# Patient Record
Sex: Female | Born: 1970 | Race: White | Hispanic: No | Marital: Married | Smoking: Never smoker
Health system: Southern US, Community
[De-identification: ages and names within clinical notes are randomized; demographics above are authoritative.]

## PROBLEM LIST (undated history)

## (undated) DIAGNOSIS — E119 Type 2 diabetes mellitus without complications: Secondary | ICD-10-CM

## (undated) DIAGNOSIS — I10 Essential (primary) hypertension: Secondary | ICD-10-CM

## (undated) HISTORY — DX: Essential (primary) hypertension: I10

## (undated) HISTORY — DX: Type 2 diabetes mellitus without complications: E11.9

## (undated) HISTORY — PX: KNEE SURGERY: SHX244

---

## 2012-02-22 ENCOUNTER — Emergency Department (HOSPITAL_COMMUNITY)
Admission: EM | Admit: 2012-02-22 | Discharge: 2012-02-23 | Disposition: A | Discharge status: Home / Self Care | Payer: BC Managed Care – PPO | Attending: Emergency Medicine | Admitting: Emergency Medicine

## 2012-02-22 ENCOUNTER — Encounter (HOSPITAL_COMMUNITY): Payer: Self-pay | Admitting: Emergency Medicine

## 2012-02-22 ENCOUNTER — Emergency Department (HOSPITAL_COMMUNITY): Payer: BC Managed Care – PPO

## 2012-02-22 DIAGNOSIS — Y92410 Unspecified street and highway as the place of occurrence of the external cause: Secondary | ICD-10-CM | POA: Insufficient documentation

## 2012-02-22 DIAGNOSIS — S060X9A Concussion with loss of consciousness of unspecified duration, initial encounter: Secondary | ICD-10-CM

## 2012-02-22 DIAGNOSIS — Y9389 Activity, other specified: Secondary | ICD-10-CM | POA: Insufficient documentation

## 2012-02-22 DIAGNOSIS — S301XXA Contusion of abdominal wall, initial encounter: Secondary | ICD-10-CM

## 2012-02-22 DIAGNOSIS — S298XXA Other specified injuries of thorax, initial encounter: Secondary | ICD-10-CM | POA: Insufficient documentation

## 2012-02-22 DIAGNOSIS — S060XAA Concussion with loss of consciousness status unknown, initial encounter: Secondary | ICD-10-CM | POA: Insufficient documentation

## 2012-02-22 DIAGNOSIS — Z7982 Long term (current) use of aspirin: Secondary | ICD-10-CM | POA: Insufficient documentation

## 2012-02-22 MED ORDER — MORPHINE SULFATE 4 MG/ML IJ SOLN
6.0000 mg | Freq: Once | INTRAMUSCULAR | Status: DC
  Filled 2012-02-22: qty 1

## 2012-02-22 MED ORDER — ONDANSETRON HCL 4 MG/2ML IJ SOLN
4.0000 mg | Freq: Once | INTRAMUSCULAR | Status: DC
  Filled 2012-02-22: qty 2

## 2012-02-22 MED ORDER — ONDANSETRON HCL 4 MG/2ML IJ SOLN
4.0000 mg | Freq: Once | INTRAMUSCULAR | Status: AC
  Administered 2012-02-22: 4 mg via INTRAVENOUS

## 2012-02-22 MED ORDER — MORPHINE SULFATE 4 MG/ML IJ SOLN
6.0000 mg | Freq: Once | INTRAMUSCULAR | Status: AC
  Administered 2012-02-22: 6 mg via INTRAVENOUS
  Filled 2012-02-22: qty 1

## 2012-02-22 NOTE — ED Notes (Signed)
I gave the patient a warm blanket. 
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
## 2012-02-23 ENCOUNTER — Emergency Department (HOSPITAL_COMMUNITY): Payer: BC Managed Care – PPO

## 2012-02-23 LAB — BASIC METABOLIC PANEL
CO2: 22 mEq/L (ref 19–32)
Chloride: 101 mEq/L (ref 96–112)
Creatinine, Ser: 0.79 mg/dL (ref 0.50–1.10)

## 2012-02-23 LAB — CBC WITH DIFFERENTIAL/PLATELET
Basophils Absolute: 0 10*3/uL (ref 0.0–0.1)
HCT: 40.2 % (ref 36.0–46.0)
Hemoglobin: 13.6 g/dL (ref 12.0–15.0)
Lymphocytes Relative: 23 % (ref 12–46)
Monocytes Absolute: 0.4 10*3/uL (ref 0.1–1.0)
Monocytes Relative: 6 % (ref 3–12)
Neutro Abs: 5.2 10*3/uL (ref 1.7–7.7)
RBC: 4.67 MIL/uL (ref 3.87–5.11)
RDW: 13.2 % (ref 11.5–15.5)
WBC: 7.4 10*3/uL (ref 4.0–10.5)

## 2012-02-23 LAB — POCT I-STAT, CHEM 8
BUN: 13 mg/dL (ref 6–23)
Creatinine, Ser: 0.7 mg/dL (ref 0.50–1.10)
Potassium: 3.3 mEq/L — ABNORMAL LOW (ref 3.5–5.1)
Sodium: 142 mEq/L (ref 135–145)
TCO2: 23 mmol/L (ref 0–100)

## 2012-02-23 MED ORDER — HYDROCODONE-ACETAMINOPHEN 5-325 MG PO TABS: 1.0000 | ORAL_TABLET | ORAL | Status: AC | PRN

## 2012-02-23 MED ORDER — ONDANSETRON 4 MG PO TBDP
8.0000 mg | ORAL_TABLET | Freq: Once | ORAL | Status: AC
  Administered 2012-02-23: 8 mg via ORAL
  Filled 2012-02-23: qty 2

## 2012-02-23 MED ORDER — ONDANSETRON HCL 4 MG/2ML IJ SOLN
INTRAMUSCULAR | Status: AC
  Filled 2012-02-23: qty 2

## 2012-02-23 MED ORDER — MORPHINE SULFATE 4 MG/ML IJ SOLN
6.0000 mg | Freq: Once | INTRAMUSCULAR | Status: AC
  Administered 2012-02-23: 6 mg via INTRAVENOUS
  Filled 2012-02-23: qty 2

## 2012-02-23 MED ORDER — ONDANSETRON 8 MG PO TBDP: 8.0000 mg | ORAL_TABLET | Freq: Three times a day (TID) | ORAL | Status: AC | PRN

## 2012-02-23 MED ORDER — ONDANSETRON HCL 4 MG/2ML IJ SOLN
4.0000 mg | Freq: Once | INTRAMUSCULAR | Status: AC
  Administered 2012-02-23: 4 mg via INTRAVENOUS
  Filled 2012-02-23: qty 2

## 2012-02-23 MED ORDER — IOHEXOL 300 MG/ML  SOLN
100.0000 mL | Freq: Once | INTRAMUSCULAR | Status: AC | PRN
  Administered 2012-02-23: 100 mL via INTRAVENOUS

## 2012-02-23 MED ORDER — OXYCODONE-ACETAMINOPHEN 5-325 MG PO TABS: 1.0000 | ORAL_TABLET | ORAL | Status: AC | PRN

## 2012-02-23 NOTE — ED Notes (Signed)
Pt to radiology for 2 view CXR
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
## 2012-02-24 ENCOUNTER — Emergency Department: Payer: Self-pay | Admitting: Emergency Medicine

## 2012-05-15 ENCOUNTER — Telehealth (HOSPITAL_COMMUNITY): Payer: Self-pay | Admitting: Emergency Medicine

## 2012-05-15 NOTE — ED Notes (Signed)
Patient called and asked for out-of-work note for attorney. Informed her that we could not because it was not written in her chart that she had been taken out of work. Advised her to ask the physician she followed-up with or she could go to medical records for her records.
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
## 2020-12-15 ENCOUNTER — Encounter: Payer: Managed Care, Other (non HMO) | Attending: Infectious Diseases | Admitting: *Deleted

## 2020-12-15 ENCOUNTER — Other Ambulatory Visit: Payer: Self-pay

## 2020-12-15 ENCOUNTER — Encounter: Payer: Self-pay | Admitting: *Deleted

## 2020-12-15 VITALS — BP 150/90 | Ht 66.0 in | Wt 218.4 lb

## 2020-12-15 DIAGNOSIS — E1165 Type 2 diabetes mellitus with hyperglycemia: Secondary | ICD-10-CM

## 2020-12-15 DIAGNOSIS — E119 Type 2 diabetes mellitus without complications: Secondary | ICD-10-CM | POA: Diagnosis not present

## 2020-12-15 DIAGNOSIS — I10 Essential (primary) hypertension: Secondary | ICD-10-CM | POA: Diagnosis not present

## 2020-12-15 NOTE — Progress Notes (Signed)
Diabetes Self-Management Education  Visit Type: First/Initial  Appt. Start Time: 1325 Appt. End Time: 1430  12/15/2020  [name], identified by name and date of birth, is a 50 y.o. female with a diagnosis of Diabetes: Type 2.   ASSESSMENT  Blood pressure (!) 150/90, height 5\' 6"  (1.676 m), weight 218 lb 6.4 oz (99.1 kg). Body mass index is 35.25 kg/m.   Diabetes Self-Management Education - 12/15/20 1453       Visit Information   Visit Type First/Initial      Initial Visit   Diabetes Type Type 2    Are you currently following a meal plan? No    Are you taking your medications as prescribed? Yes    Date Diagnosed 5 months ago      Health Coping   How would you rate your overall health? Fair      Psychosocial Assessment   Patient Belief/Attitude about Diabetes Defeat/Burnout   "feel bummed"   Self-care barriers None    Self-management support Doctor's office;Family    Patient Concerns Nutrition/Meal planning;Medication;Monitoring;Healthy Lifestyle;Problem Solving;Glycemic Control;Weight Control    Special Needs None    Preferred Learning Style Auditory;Hands on    Learning Readiness Change in progress    How often do you need to have someone help you when you read instructions, pamphlets, or other written materials from your doctor or pharmacy? 1 - Never    What is the last grade level you completed in school? 12+      Pre-Education Assessment   Patient understands the diabetes disease and treatment process. Needs Instruction    Patient understands incorporating nutritional management into lifestyle. Needs Instruction    Patient undertands incorporating physical activity into lifestyle. Needs Instruction    Patient understands using medications safely. Needs Instruction    Patient understands monitoring blood glucose, interpreting and using results Needs Review    Patient understands prevention, detection, and treatment of acute complications. Needs  Instruction    Patient understands prevention, detection, and treatment of chronic complications. Needs Instruction    Patient understands how to develop strategies to address psychosocial issues. Needs Instruction    Patient understands how to develop strategies to promote health/change behavior. Needs Instruction      Complications   Last HgB A1C per patient/outside source 8.2 %   11/08/20 - pt reports reading of 12.8% in May   How often do you check your blood sugar? 3-4 times/day    Fasting Blood glucose range (mg/dL) June   she reports FBG's usually 150-200 mg/dL   Postprandial Blood glucose range (mg/dL) [phone];500-938   she reports pp lunch and bedtime 150-200 mg/dL   Number of hypoglycemic episodes per month 1   Pt reports symptoms of hypoglycemia and her BG was 123. She had been averaging 300 mg/dL at diagnosis.   Can you tell when your blood sugar is low? No    Have you had a dilated eye exam in the past 12 months? Yes    Have you had a dental exam in the past 12 months? Yes    Are you checking your feet? No      Dietary Intake   Breakfast breakfast sandwich; eggs and bacon; low carb tortilla with eggs, hot sauce, ham    Snack (morning) reports 2 snacks - usually cheese, nuts, raisins, cranberries, lunch meat, carrots and dip, fruit (watermelon, grapes, apple/pear)    [phone] Cuisine or left overs    Dinner chicken, occasional beef, [employer], pork,  fish; potatoes, pasta, rice, carrots, broccoli, cauliflower, squash, zucchini, green beans, beets    Beverage(s) water, diet soda, Crystal lite, [place], coffee      Exercise   Exercise Type ADL's      Patient Education   Previous Diabetes Education No    Disease state  Definition of diabetes, type 1 and 2, and the diagnosis of diabetes;Factors that contribute to the development of diabetes    Nutrition management  Role of diet in the treatment of diabetes and the relationship between the three main macronutrients and blood  glucose level;Food label reading, portion sizes and measuring food.;Carbohydrate counting;Reviewed blood glucose goals for pre and post meals and how to evaluate the patients' food intake on their blood glucose level.    Physical activity and exercise  Role of exercise on diabetes management, blood pressure control and cardiac health.    Medications Reviewed patients medication for diabetes, action, purpose, timing of dose and side effects.    Monitoring Purpose and frequency of SMBG.;Taught/discussed recording of test results and interpretation of SMBG.;Identified appropriate SMBG and/or A1C goals.    Acute complications Taught treatment of hypoglycemia - the 15 rule.    Chronic complications Relationship between chronic complications and blood glucose control    Psychosocial adjustment Role of stress on diabetes;Identified and addressed patients feelings and concerns about diabetes      Individualized Goals (developed by patient)   Reducing Risk Other (comment)   improve blood sugars, decrease medications, prevent diabetes complications, lose weight, lead a healthier lifestyle, become more fit     Outcomes   Expected Outcomes Demonstrated interest in learning. Expect positive outcomes        Individualized Plan for Diabetes Self-Management Training:   Learning Objective:  Patient will have a greater understanding of diabetes self-management. Patient education plan is to attend individual and/or group sessions per assessed needs and concerns.   Plan:   Patient Instructions  Check blood sugars 2 x day before breakfast and 2 hrs after one meal every day Bring blood sugar records to the next appointment/class  Exercise:  Begin walking  for    15  minutes   3  days a week and gradually increase to 30 minutes 5 x week  Eat 3 meals day,   2  snacks a day Space meals 4-6 hours apart Don't skip meals - include at least 1 protein and 1 carbohydrate serving  Call back if you are able to  attend classes or an appointment with the nurse or dietitian  Expected Outcomes:  Demonstrated interest in learning. Expect positive outcomes  Education material provided:  General Meal Planning Guidelines Simple Meal Plan Glucose tablets Symptoms, causes and treatments of Hypoglycemia Healthy Snack Choices (ADA)  If problems or questions, patient to contact team via:   Sharion Settler, RN, CCM, CDCES [phone]  Future DSME appointment: Patient to check her insurance plan for coverage of Diabetes classes or an additional appointment with this nurse or dietitian.

## 2020-12-15 NOTE — Patient Instructions (Signed)
Check blood sugars 2 x day before breakfast and 2 hrs after one meal every day Bring blood sugar records to the next appointment/class  Exercise:  Begin walking  for    15  minutes   3  days a week and gradually increase to 30 minutes 5 x week  Eat 3 meals day,   2  snacks a day Space meals 4-6 hours apart Don't skip meals - include at least 1 protein and 1 carbohydrate serving  Call back if you are able to attend classes or an appointment with the nurse or dietitian

## 2021-01-15 ENCOUNTER — Encounter: Payer: Self-pay | Admitting: *Deleted

## 2021-12-18 ENCOUNTER — Other Ambulatory Visit: Payer: Self-pay | Admitting: Infectious Diseases

## 2021-12-18 DIAGNOSIS — Z1231 Encounter for screening mammogram for malignant neoplasm of breast: Secondary | ICD-10-CM

## 2022-07-26 ENCOUNTER — Other Ambulatory Visit: Payer: Self-pay

## 2022-07-26 DIAGNOSIS — R7989 Other specified abnormal findings of blood chemistry: Secondary | ICD-10-CM

## 2022-07-26 DIAGNOSIS — E6609 Other obesity due to excess calories: Secondary | ICD-10-CM

## 2022-07-30 ENCOUNTER — Ambulatory Visit: Payer: 59
